# Patient Record
Sex: Male | Born: 1990 | Hispanic: Yes | Marital: Single | Smoking: Never smoker
Health system: Southern US, Community
[De-identification: ages and names within clinical notes are randomized; demographics above are authoritative.]

---

## 2018-12-03 ENCOUNTER — Emergency Department (HOSPITAL_COMMUNITY)
Admission: EM | Admit: 2018-12-03 | Discharge: 2018-12-03 | Disposition: A | Discharge status: Home / Self Care | Payer: Self-pay | Attending: Emergency Medicine | Admitting: Emergency Medicine

## 2018-12-03 ENCOUNTER — Encounter (HOSPITAL_COMMUNITY): Payer: Self-pay | Admitting: *Deleted

## 2018-12-03 ENCOUNTER — Emergency Department (HOSPITAL_COMMUNITY): Payer: Self-pay

## 2018-12-03 ENCOUNTER — Other Ambulatory Visit: Payer: Self-pay

## 2018-12-03 DIAGNOSIS — R1031 Right lower quadrant pain: Secondary | ICD-10-CM | POA: Insufficient documentation

## 2018-12-03 LAB — COMPREHENSIVE METABOLIC PANEL
ALT: 35 U/L (ref 0–44)
AST: 24 U/L (ref 15–41)
Albumin: 4.6 g/dL (ref 3.5–5.0)
Alkaline Phosphatase: 89 U/L (ref 38–126)
Anion gap: 8 (ref 5–15)
BUN: 15 mg/dL (ref 6–20)
CO2: 25 mmol/L (ref 22–32)
Calcium: 9.2 mg/dL (ref 8.9–10.3)
Chloride: 104 mmol/L (ref 98–111)
Creatinine, Ser: 0.66 mg/dL (ref 0.61–1.24)
GFR calc Af Amer: >60 mL/min (ref >60)
GFR calc non Af Amer: >60 mL/min (ref >60)
Glucose, Bld: 113 mg/dL — ABNORMAL HIGH (ref 70–99)
Potassium: 3.8 mmol/L (ref 3.5–5.1)
Sodium: 137 mmol/L (ref 135–145)
Total Bilirubin: 1.1 mg/dL (ref 0.3–1.2)
Total Protein: 7.7 g/dL (ref 6.5–8.1)

## 2018-12-03 LAB — URINALYSIS, ROUTINE W REFLEX MICROSCOPIC
Bacteria, UA: NONE SEEN
Bilirubin Urine: NEGATIVE
Glucose, UA: NEGATIVE mg/dL
Ketones, ur: NEGATIVE mg/dL
Leukocytes,Ua: NEGATIVE
Nitrite: NEGATIVE
Protein, ur: NEGATIVE mg/dL
Specific Gravity, Urine: 1.018 (ref 1.005–1.030)
pH: 6 (ref 5.0–8.0)

## 2018-12-03 LAB — LIPASE, BLOOD: Lipase: 23 U/L (ref 11–51)

## 2018-12-03 LAB — CBC WITH DIFFERENTIAL/PLATELET
Abs Immature Granulocytes: 0.06 10*3/uL (ref 0.00–0.07)
Basophils Absolute: 0.1 10*3/uL (ref 0.0–0.1)
Basophils Relative: 1 %
Eosinophils Absolute: 0.5 10*3/uL (ref 0.0–0.5)
Eosinophils Relative: 4 %
HCT: 46.8 % (ref 39.0–52.0)
Hemoglobin: 15.9 g/dL (ref 13.0–17.0)
Immature Granulocytes: 0 %
Lymphocytes Relative: 24 %
Lymphs Abs: 3.4 10*3/uL (ref 0.7–4.0)
MCH: 29.8 pg (ref 26.0–34.0)
MCHC: 34 g/dL (ref 30.0–36.0)
MCV: 87.8 fL (ref 80.0–100.0)
Monocytes Absolute: 1 10*3/uL (ref 0.1–1.0)
Monocytes Relative: 7 %
Neutro Abs: 9.5 10*3/uL — ABNORMAL HIGH (ref 1.7–7.7)
Neutrophils Relative %: 64 %
Platelets: 294 10*3/uL (ref 150–400)
RBC: 5.33 MIL/uL (ref 4.22–5.81)
RDW: 12.7 % (ref 11.5–15.5)
WBC: 14.6 10*3/uL — ABNORMAL HIGH (ref 4.0–10.5)
nRBC: 0 % (ref 0.0–0.2)

## 2018-12-03 MED ORDER — NAPROXEN 500 MG PO TABS: 500.0000 mg | ORAL_TABLET | Freq: Two times a day (BID) | ORAL | 0 refills | Status: AC | PRN

## 2018-12-03 MED ORDER — IOHEXOL 300 MG/ML  SOLN
100.0000 mL | Freq: Once | INTRAMUSCULAR | Status: AC | PRN
  Administered 2018-12-03: 100 mL via INTRAVENOUS

## 2018-12-03 NOTE — ED Notes (Signed)
Urinal at bedside. Pt aware of need for urine.

## 2018-12-03 NOTE — ED Notes (Signed)
Discharge instructions interpreted by Spanish interpreter.

## 2018-12-03 NOTE — ED Provider Notes (Signed)
[facility] EMERGENCY DEPARTMENT Provider Note   CSN: [id number] Arrival date & time: 12/03/18  4098    History   Chief Complaint Chief Complaint  Patient presents with  . Abdominal Pain    HPI Alan West is a 28 y.o. male.     Level 5 caveat for language barrier.  Translator used.  Patient with no medical history.  Developed right-sided lower abdominal pain about 5 PM this is progressively worsening.  He feels some pain in the center of his abdomen to but no pain in his back.  The pain is worse with sitting and certain movements.  Denies any falls or trauma.  Denies having this pain before.  No fevers, chills, nausea, vomiting, diarrhea or constipation.  No pain with urination or blood in the urine.  No testicular pain.  No medical history and no previous abdominal surgeries.  The history is provided by the patient. The history is limited by a language barrier. A language interpreter was used.  Abdominal Pain  Associated symptoms: no cough, no diarrhea, no dysuria, no hematuria, no nausea, no shortness of breath and no vomiting     History reviewed. No pertinent past medical history.  There are no active problems to display for this patient.   History reviewed. No pertinent surgical history.      Home Medications    Prior to Admission medications   Not on File    Family History No family history on file.  Social History Social History   Tobacco Use  . Smoking status: Never Smoker  . Smokeless tobacco: Never Used  Substance Use Topics  . Alcohol use: Never    Frequency: Never  . Drug use: Never     Allergies   Patient has no known allergies.   Review of Systems Review of Systems  Constitutional: Negative for activity change and appetite change.  HENT: Negative for congestion and rhinorrhea.   Respiratory: Negative for cough, chest tightness and shortness of breath.   Gastrointestinal: Positive for abdominal pain. Negative for diarrhea, nausea and  vomiting.  Genitourinary: Negative for dysuria and hematuria.  Musculoskeletal: Negative for arthralgias and myalgias.  Skin: Negative for rash.  Neurological: Negative for dizziness, weakness and headaches.    all other systems are negative except as noted in the HPI and PMH.    Physical Exam Updated Vital Signs BP 137/82 (BP Location: Left Arm)   Pulse 70   Temp 98.2 F (36.8 C) (Oral)   Resp 16   Wt 74.8 kg   SpO2 100%   Physical Exam Vitals signs and nursing note reviewed.  Constitutional:      General: He is not in acute distress.    Appearance: He is well-developed.  HENT:     Head: Normocephalic and atraumatic.     Mouth/Throat:     Pharynx: No oropharyngeal exudate.  Eyes:     Conjunctiva/sclera: Conjunctivae normal.     Pupils: Pupils are equal, round, and reactive to light.  Neck:     Musculoskeletal: Normal range of motion and neck supple.     Comments: No meningismus. Cardiovascular:     Rate and Rhythm: Normal rate and regular rhythm.     Heart sounds: Normal heart sounds. No murmur.  Pulmonary:     Effort: Pulmonary effort is normal. No respiratory distress.     Breath sounds: Normal breath sounds.  Abdominal:     Palpations: Abdomen is soft.     Tenderness: There is abdominal tenderness.  There is guarding. There is no rebound.     Comments: Right lower quadrant and periumbilical tenderness with voluntary guarding.  Genitourinary:    Comments: Uncircumcised, no testicular tenderness Musculoskeletal: Normal range of motion.        General: No tenderness.     Comments: No CVA tenderness  Skin:    General: Skin is warm.     Capillary Refill: Capillary refill takes less than 2 seconds.  Neurological:     General: No focal deficit present.     Mental Status: He is alert and oriented to person, place, and time. Mental status is at baseline.     Cranial Nerves: No cranial nerve deficit.     Motor: No abnormal muscle tone.     Coordination: Coordination  normal.     Comments: No ataxia on finger to nose bilaterally. No pronator drift. 5/5 strength throughout. CN 2-12 intact.Equal grip strength. Sensation intact.   Psychiatric:        Behavior: Behavior normal.      ED Treatments / Results  Labs (all labs ordered are listed, but only abnormal results are displayed) Labs Reviewed  URINALYSIS, ROUTINE W REFLEX MICROSCOPIC - Abnormal; Notable for the following components:      Result Value   Color, Urine STRAW (*)    Hgb urine dipstick SMALL (*)    All other components within normal limits  CBC WITH DIFFERENTIAL/PLATELET - Abnormal; Notable for the following components:   WBC 14.6 (*)    Neutro Abs 9.5 (*)    All other components within normal limits  COMPREHENSIVE METABOLIC PANEL - Abnormal; Notable for the following components:   Glucose, Bld 113 (*)    All other components within normal limits  LIPASE, BLOOD    EKG None  Radiology Ct Abdomen Pelvis W Contrast  Result Date: 12/03/2018 CLINICAL DATA:  Right lower quadrant abdominal pain. EXAM: CT ABDOMEN AND PELVIS WITH CONTRAST TECHNIQUE: Multidetector CT imaging of the abdomen and pelvis was performed using the standard protocol following bolus administration of intravenous contrast. CONTRAST:  OMNIPAQUE IOHEXOL 300 MG/ML  SOLN COMPARISON:  None. FINDINGS: Lower chest: Lung bases are clear. Hepatobiliary: No focal liver abnormality is seen. No gallstones, gallbladder wall thickening, or biliary dilatation. Pancreas: No ductal dilatation or inflammation. Spleen: Normal in size without focal abnormality. Adrenals/Urinary Tract: Normal adrenal glands. No hydronephrosis or perinephric edema. Homogeneous renal enhancement. Urinary bladder is physiologically distended without wall thickening. Stomach/Bowel: Normal appendix, for example image 60 series 2. Stomach physiologically distended. No small bowel wall thickening, inflammatory change, or obstruction. No terminal ileal  inflammation. Moderate colonic stool burden. No colonic wall thickening or inflammation. Vascular/Lymphatic: Normal caliber abdominal aorta. Portal vein is patent. Mesenteric vessels are patent. Few prominent right inguinal nodes measuring up to 10 mm short axis, likely reactive. No enlarged lymph nodes in the abdomen or pelvis. Reproductive: Prostate is unremarkable. Other: Mild patchy subcutaneous edema overlies the right lower anterior abdominal wall as well as right groin. No soft tissue air. Free fluid or free air. No intra-abdominal abscess. Musculoskeletal: Bilateral L5 pars interarticularis defects without listhesis. There are no acute or suspicious osseous abnormalities. IMPRESSION: 1. Mild subcutaneous edema of the right lower anterior abdominal wall and right groin, may be cellulitis, inflammatory, or contusion if there is history of trauma. No focal fluid collection. 2. No additional acute abnormality in the abdomen/pelvis. Normal appendix. 3. 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 Electronically Signed   By: Narda Rutherford  M.D.   On: 12/03/2018 04:01    Procedures Procedures (including critical care time)  Medications Ordered in ED Medications - No data to display   Initial Impression / Assessment and Plan / ED Course  I have reviewed the triage vital signs and the nursing notes.  Pertinent labs & imaging results that were available during my care of the patient were reviewed by me and considered in my medical decision making (see chart for details).       Right-sided abdominal pain without associated symptoms. We will check urinalysis.  Consider appendicitis, kidney stone, muscular pain  Leukocytosis noted.  Urinalysis with trace blood. With ongoing pain and leukocytosis, CT scan obtained to evaluate appendix.  Appendix is normal on CT scan.  There is some soft tissue edema of the right groin and abdomen. Patient has no clinical evidence of  cellulitis and there is no overlying skin change, warmth or redness.  Results discussed with patient.  Will treat supportively for likely musculoskeletal pain.  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  The pain is worse when he lies back and better when he sits up.  Advised him to watch his skin closely for development of possible redness or warmth. Return precautions discussed including worsening right lower quadrant pain, fever, vomiting or other concerns.  Final Clinical Impressions(s) / ED Diagnoses   Final diagnoses:  Right lower quadrant abdominal pain    ED Discharge Orders    None       Dianne Whelchel, Jeannett SeniorStephen, MD 12/03/18 [phone]

## 2018-12-03 NOTE — ED Notes (Signed)
ED Provider at bedside. 

## 2018-12-03 NOTE — Discharge Instructions (Signed)
Your CT scan shows a normal appendix.  There appears to be some inflammation of the muscles in this area which may be due to lifting at work.  There is no signs of infection.  Follow-up with your doctor.  Return to the ED with worsening pain especially in the right lower stomach, fever, vomiting or other concerns.

## 2018-12-03 NOTE — ED Triage Notes (Signed)
Pt c/o right lower quad abd pain that started this evening, denies any n/v/d, fevers,

## 2020-07-24 IMAGING — CT CT ABDOMEN AND PELVIS WITH CONTRAST
2 of 4 series · 16 of 46 positions shown, 18 images · IV contrast (omnipaque)
Comparison: None.

CLINICAL DATA: Right lower quadrant abdominal pain.

EXAM:
CT ABDOMEN AND PELVIS WITH CONTRAST
TECHNIQUE: Multidetector CT imaging of the abdomen and pelvis was performed
using the standard protocol following bolus administration of
intravenous contrast.
CONTRAST:  100mL OMNIPAQUE IOHEXOL 300 MG/ML  SOLN

[Series 2: axial st · axial · 0.74mm/px · z∈[+742,+1192]mm · 13 of 101 slices shown, 15 images]
[im 6/101  soft-tissue]
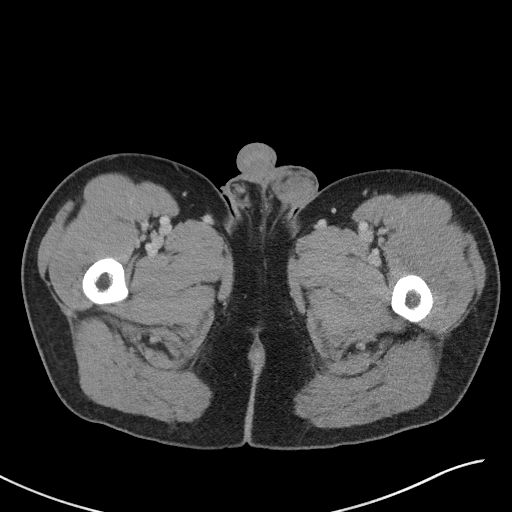
[im 6/101  bone]
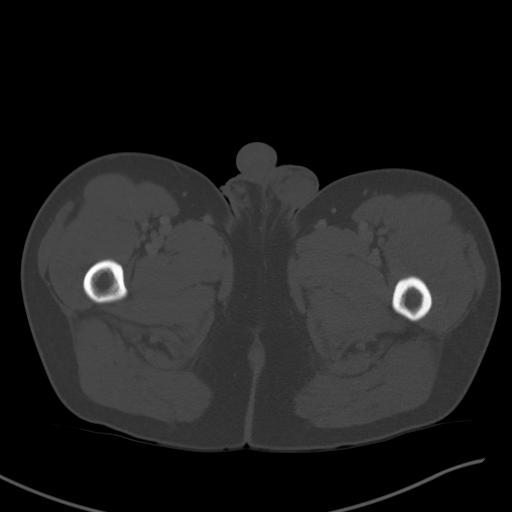
[im 16/101  soft-tissue]
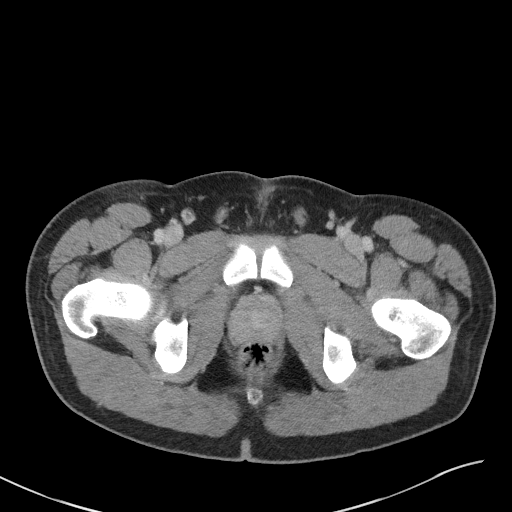
[im 21/101  soft-tissue]
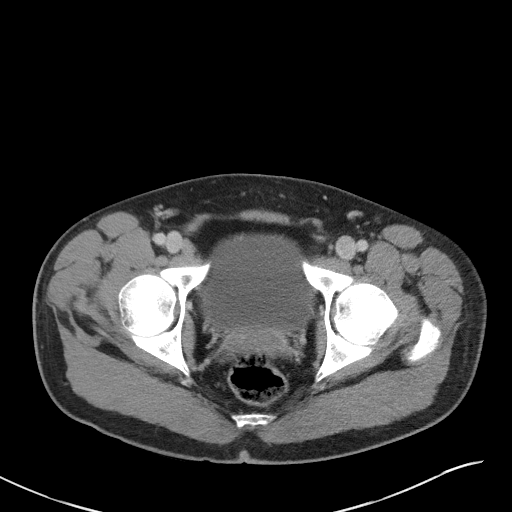
[im 31/101  soft-tissue]
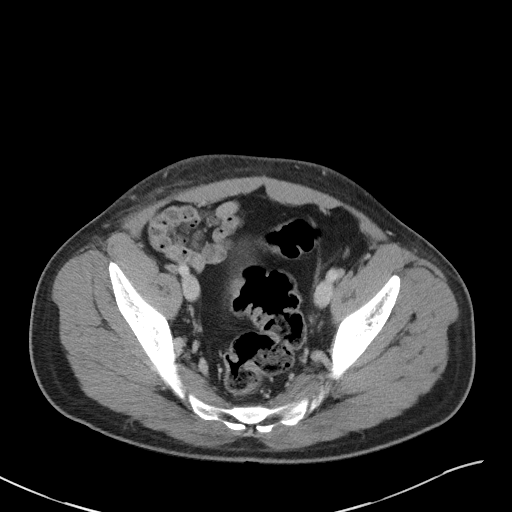
[im 36/101  soft-tissue]
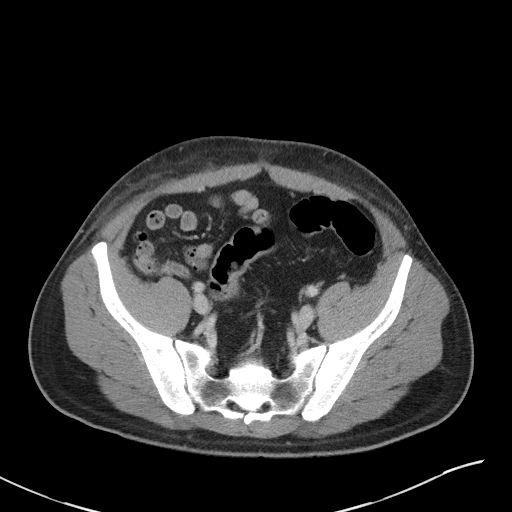
[im 46/101  soft-tissue]
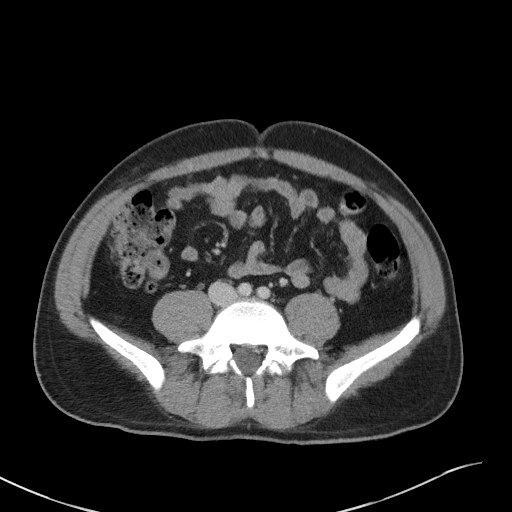
[im 51/101  soft-tissue]
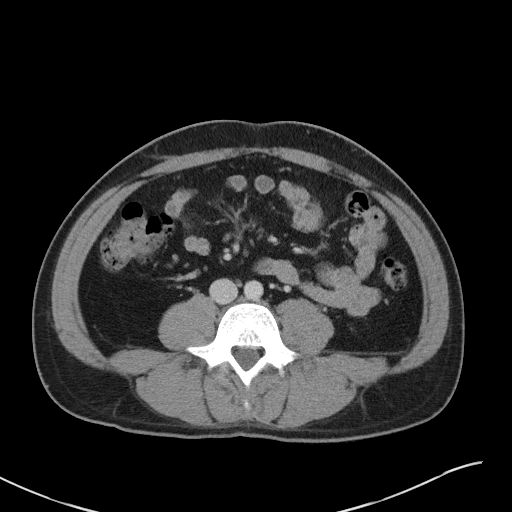
[im 56/101  soft-tissue]
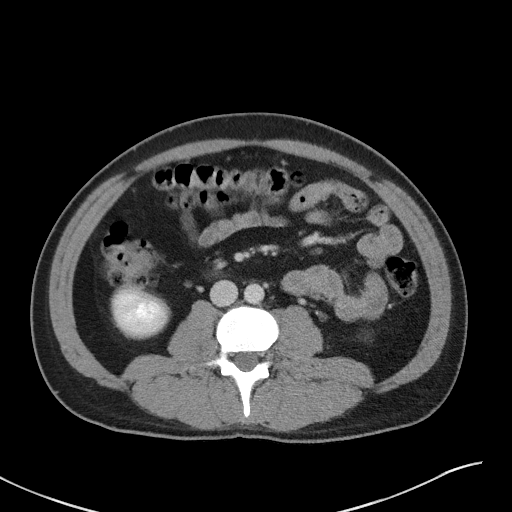
[im 66/101  soft-tissue]
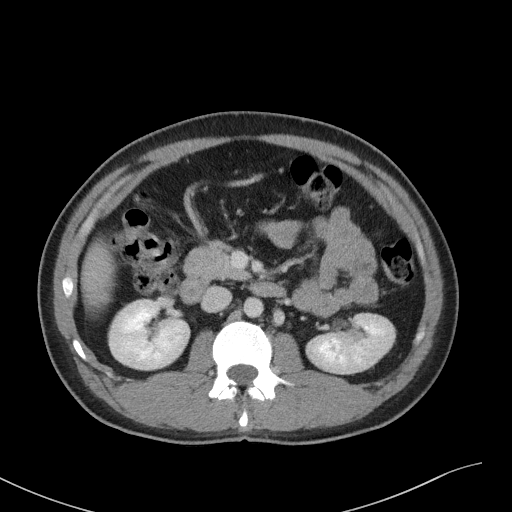
[im 66/101  bone]
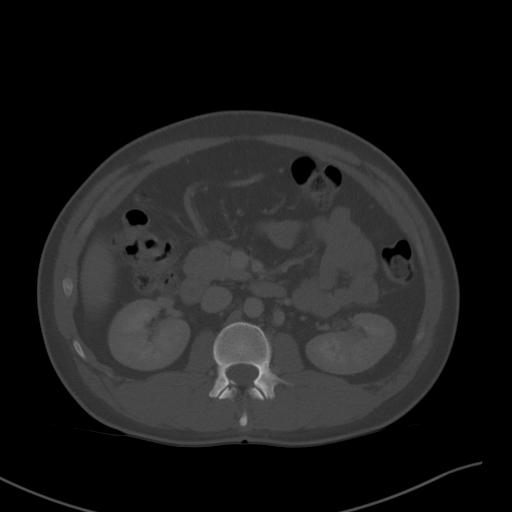
[im 71/101  soft-tissue]
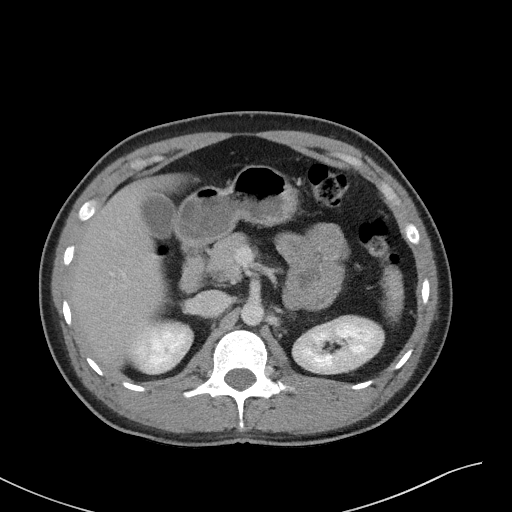
[im 81/101  soft-tissue]
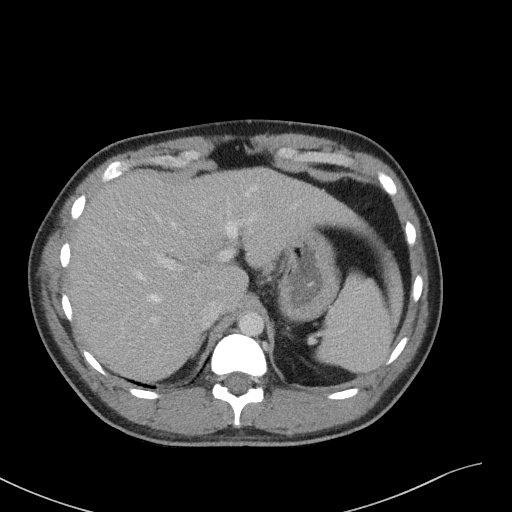
[im 86/101  soft-tissue]
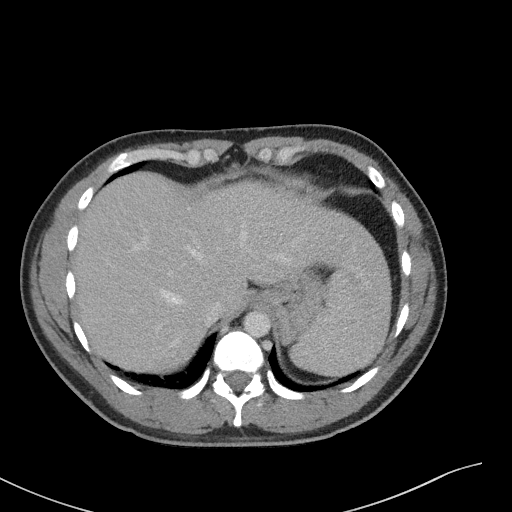
[im 96/101  soft-tissue]
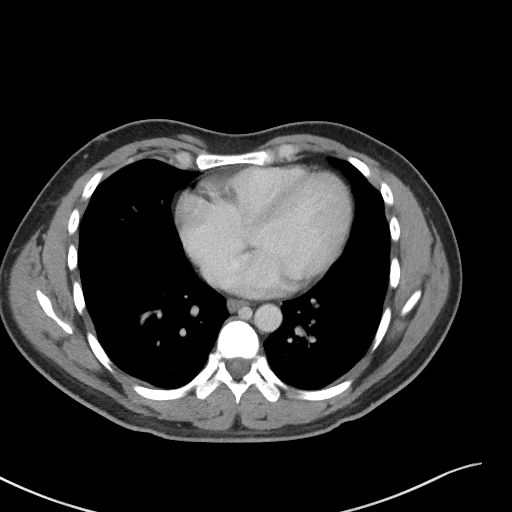

[Series 5: coronal st · coronal · 0.76mm/px · 3 of 99 slices shown]
[im 33/99  soft-tissue]
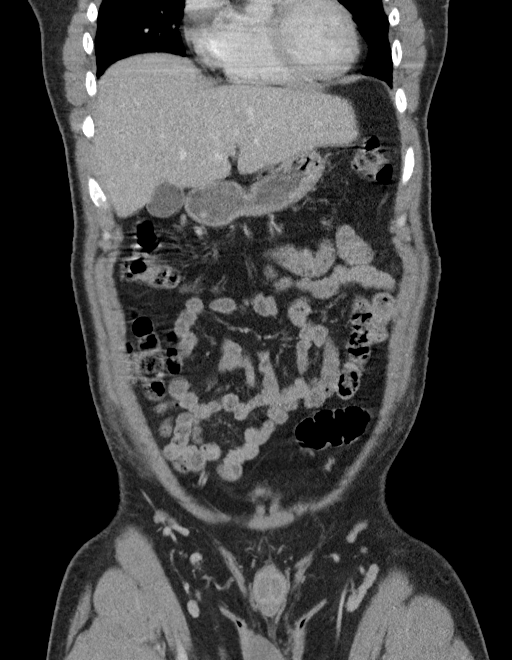
[im 44/99  soft-tissue]
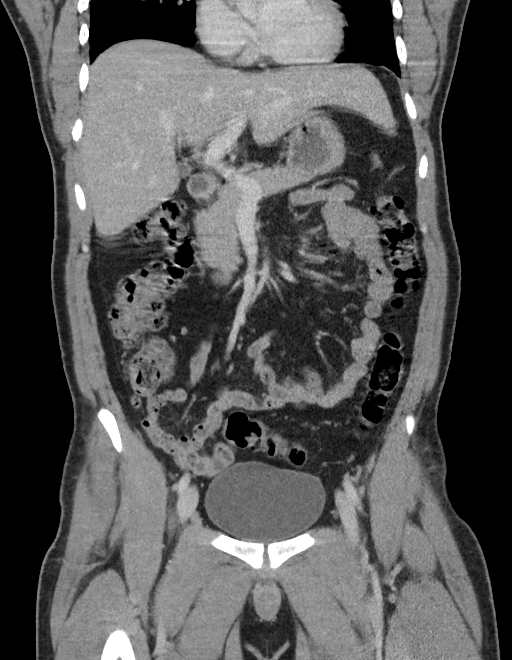
[im 55/99  soft-tissue]
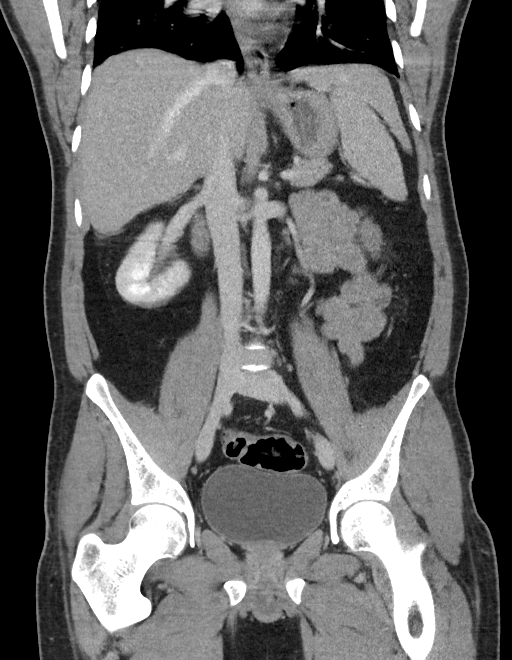

[16 of 46 positions shown; findings below may reference images not displayed]

FINDINGS: Lower chest: Lung bases are clear.

Hepatobiliary: No focal liver abnormality is seen. No gallstones,
gallbladder wall thickening, or biliary dilatation.

Pancreas: No ductal dilatation or inflammation.

Spleen: Normal in size without focal abnormality.

Adrenals/Urinary Tract: Normal adrenal glands. No hydronephrosis or
perinephric edema. Homogeneous renal enhancement. Urinary bladder is
physiologically distended without wall thickening.

Stomach/Bowel: Normal appendix, for example image 60 series 2.
Stomach physiologically distended. No small bowel wall thickening,
inflammatory change, or obstruction. No terminal ileal inflammation.
Moderate colonic stool burden. No colonic wall thickening or
inflammation.

Vascular/Lymphatic: Normal caliber abdominal aorta. Portal vein is
patent. Mesenteric vessels are patent. Few prominent right inguinal
nodes measuring up to 10 mm short axis, likely reactive. No enlarged
lymph nodes in the abdomen or pelvis.

Reproductive: Prostate is unremarkable.

Other: Mild patchy subcutaneous edema overlies the right lower
anterior abdominal wall as well as right groin. No soft tissue air.
Free fluid or free air. No intra-abdominal abscess.

Musculoskeletal: Bilateral L5 pars interarticularis defects without
listhesis. There are no acute or suspicious osseous abnormalities.
IMPRESSION: 1. Mild subcutaneous edema of the right lower anterior abdominal
wall and right groin, may be cellulitis, inflammatory, or contusion
if there is history of trauma. No focal fluid collection.
2. No additional acute abnormality in the abdomen/pelvis. Normal
appendix.
3. Incidental bilateral L5 pars interarticularis defects without
listhesis.
